# Patient Record
Sex: Female | Born: 2000 | Race: White | Hispanic: No | Marital: Single | Smoking: Never smoker
Health system: Southern US, Community
[De-identification: ages and names within clinical notes are randomized; demographics above are authoritative.]

## PROBLEM LIST (undated history)

## (undated) VITALS — BP 108/59 | HR 72 | Temp 99.0°F | Resp 22 | Wt 114.2 lb

## (undated) DIAGNOSIS — F4310 Post-traumatic stress disorder, unspecified: Secondary | ICD-10-CM

## (undated) DIAGNOSIS — F419 Anxiety disorder, unspecified: Secondary | ICD-10-CM

## (undated) DIAGNOSIS — F329 Major depressive disorder, single episode, unspecified: Secondary | ICD-10-CM

## (undated) DIAGNOSIS — F32A Depression, unspecified: Secondary | ICD-10-CM

## (undated) DIAGNOSIS — Z7251 High risk heterosexual behavior: Secondary | ICD-10-CM

## (undated) DIAGNOSIS — Z113 Encounter for screening for infections with a predominantly sexual mode of transmission: Secondary | ICD-10-CM

## (undated) DIAGNOSIS — N912 Amenorrhea, unspecified: Secondary | ICD-10-CM

---
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
## 2018-02-28 MED ORDER — SPRINTEC 28 0.25-35 MG-MCG PO TABS
PACK | ORAL | 3 refills | Status: DC
Start: 2018-02-28 — End: 2019-07-06

## 2018-02-28 NOTE — Telephone Encounter (Signed)
Patient needs ocp rx sent as a 90 day supply.

## 2019-07-03 NOTE — Telephone Encounter (Signed)
S: Pt calling [facility] for positive pregnancy test  B: requesting appointment  A: Pt had positive home pregnancy tests x 4, LMP was 05/24/19. She is having constipation as well. Pt having mild abdominal cramping, denies bleeding.   R: Pt scheduled for mychart virtual visit for amenorrhea on Friday per her request, 07/06/19 at 11:45 am with [facility]. Pt sent link to set up mychart and advised to call back for further needs. No insurance currently, in process of getting medicaid.     Reason for Disposition  ??? Wants a pregnancy test done in the office    Protocols used: MENSTRUAL PERIOD - MISSED OR LATE-ADULT-[place]

## 2019-07-06 ENCOUNTER — Telehealth
Admit: 2019-07-06 | Discharge: 2019-07-06 | Payer: MEDICAID | Attending: Advanced Practice Midwife | Primary: Family Medicine

## 2019-07-06 DIAGNOSIS — N912 Amenorrhea, unspecified: Secondary | ICD-10-CM

## 2019-07-06 MED ORDER — DOCUSATE SODIUM 100 MG PO CAPS
100 MG | ORAL_CAPSULE | Freq: Two times a day (BID) | ORAL | 0 refills | Status: AC | PRN
Start: 2019-07-06 — End: ?

## 2019-07-06 NOTE — Progress Notes (Signed)
Patient was seen today via Telehealth by agreement and consent in light of the current COVID-19 pandemic. I used the following Telehealth technology: Audio and video capabilities. This patient encounter is appropriate and reasonable under the circumstances given the patient's particular presentation at this time. The patient has been advised of the potential risks and limitations of this mode of treatment (including but not limited to the absence of in-person examination) and has agreed to be treated in a remote fashion in spite of them. Any and all of the patient's/patient's family's questions on this issue have been answered and I have made no promises or guarantees to the patient. The patient has also been advised to contact this office for worsening conditions or problems, and seek emergency medical treatment and/or call 911 if the patient deems either necessary. The patient stated that they are currently in the state of [place]. If the patient is a minor, permission has been obtained by the parent or guardian for the patient to receive medical care at this visit.    Due to COVID 19 Pandemic and current restrictions, the patient consented to telehealth visit.    Suzanne Chang      07/06/2019    Date Of Birth:  2001/01/13      Chief Complaint   Patient presents with   ??? Amenorrhea            HPI:      Suzanne Chang is a 19 y.o. female    The visit is regarding no period and positive urine pregnancy test at home.      No LMP recorded.    EDC based on LMP:  02/18/20    Estimated weeks gestation:  6 week(s) and 1 day(s)    Average Cycle length:  28 days    Menses are regular    Vaginal bleeding:  no    Pelvic pain:  Yes - twinging / pulling    Currently taking a prenatal vitamin:  yes    Has nausea and/or vomiting:  Yes nausea no vomiting    Fatigue:  yes    Breast tenderness:  Yes - had some milky d/c    Smoking:  No - quit    Takes Medications:  yes - prenatals    REVIEW OF SYSTEMS:  Constitutional: No fever,  chills or malaise; No weight change.  Positive for fatigue and appetite changes.  HENT: No vision changes, Headache, Dizziness, Congestion  Respiratory:  Negative for cough and chest tightness  Cardiovascular:  Negative for chest pain, palpitations, and leg swelling.  Breast: No breast abnormalities or lumps  Gastrointestinal: No Indigestion, Heartburn, Diarrhea, Constipation,or Bowel Changes; No Bloody Stools  Genito-Urinary: No Dysuria, Hematuria or Nocturia. No Urinary Incontinence or Vaginal Discharge  Musculoskeletal:  Negative for back pain  Skin:  Negative for pallor and rash  Neurological:  Negative for seizures and headaches  Allergic/Immunologic:  Negative for environmental and food allergies  Psych: Negative for depression, homicidal thoughts, suicidal thoughts, anxiety, and confusion    OB History   Gravida Para Term Preterm AB Living   2 0 0 0 1 0   SAB TAB Ectopic Molar Multiple Live Births   1 0 0 0 0 0      # Outcome Date GA Lbr Len/2nd Weight Sex Delivery Anes PTL Lv   2 Gravida            1 SAB  Past Medical History:   Diagnosis Date   ??? Anxiety    ??? Crohn's disease (HCC)    ??? Depression    ??? Herpes    ??? PTSD (post-traumatic stress disorder)        Past Surgical History:   Procedure Laterality Date   ??? BREAST CYST ASPIRATION Bilateral        Family History   Problem Relation Age of Onset   ??? No Known Problems Mother    ??? No Known Problems Father        Social History     Socioeconomic History   ??? Marital status: Single     Spouse name: Not on file   ??? Number of children: Not on file   ??? Years of education: Not on file   ??? Highest education level: Not on file   Occupational History   ??? Not on file   Social Needs   ??? Financial resource strain: Not on file   ??? Food insecurity     Worry: Not on file     Inability: Not on file   ??? Transportation needs     Medical: Not on file     Non-medical: Not on file   Tobacco Use   ??? Smoking status: Current Some Day Smoker     Types: Cigarettes   ???  Smokeless tobacco: Never Used   Substance and Sexual Activity   ??? Alcohol use: No   ??? Drug use: Not Currently     Types: Marijuana     Comment: quit since found out pregnant   ??? Sexual activity: Yes     Partners: Male   Lifestyle   ??? Physical activity     Days per week: Not on file     Minutes per session: Not on file   ??? Stress: Not on file   Relationships   ??? Social Wellsite geologist on phone: Not on file     Gets together: Not on file     Attends religious service: Not on file     Active member of club or organization: Not on file     Attends meetings of clubs or organizations: Not on file     Relationship status: Not on file   ??? Intimate partner violence     Fear of current or ex partner: Not on file     Emotionally abused: Not on file     Physically abused: Not on file     Forced sexual activity: Not on file   Other Topics Concern   ??? Not on file   Social History Narrative   ??? Not on file         MEDICATIONS:    Current Outpatient Medications   Medication Sig Dispense Refill   ??? docusate sodium (COLACE) 100 MG capsule Take 1 capsule by mouth 2 times daily as needed for Constipation 60 capsule 0   ??? valACYclovir (VALTREX) 500 MG tablet Take 500 mg by mouth 2 times daily Indications: once per day        No current facility-administered medications for this visit.          ALLERGIES:    Allergies as of 07/06/2019 - Review Complete 07/06/2019   Allergen Reaction Noted   ??? Shellfish-derived products  05/31/2016         PHYSICAL EXAM:    Well-appearing, comfortable, attentive  Awake and alert, oriented x 3  Respirations even and  unlabored, no cough  Pleasant mood, full affect  Recent and remote memory intact, no pressured speech or mood lability.      DIAGNOSTICS:  No results found.     LAB RESULTS:  Results for orders placed or performed in visit on 12/09/17   POCT Wet Prep   Result Value Ref Range    Wet Prep Neg whiff     Hyphal Yeast none     White Blood Cells 1:1/epi     Trichomonas, UA none     Clue Cells,  Wet Prep none     Ph normal        DIAGNOSIS:   Diagnosis Orders   1. Amenorrhea  ABO/Rh    Antibody Screen    HCG, Quantitative, Pregnancy       PLAN:  Quant HCG and type and screen when able.    Dating and viability ultrasound - pt wishes to have care with [facility], and does not wish to schedule any follow up with [place] at this time. I let her know she may call and schedule any time if desired or needed.    Advised to take a prenatal vitamin with iron, not a gummy vitamin after the first trimester.  Prescription sent if needed.    Reviewed relief measures for nausea and vomiting including over the counter B6 and doxylamine.      Return for Pt only  wants to do labs. Does not want to schedule U/S or f/u visit at this time..  Labs ordered, [facility] information in [facility]    Electronically signed by Micael Hampshire, APRN - CNM on 07/06/19 at 11:47 AM EDT    Patient was  evaluated by telehealth today for evaluation, counseling and education regarding The encounter diagnosis was Amenorrhea. and Amenorrhea  .

## 2019-07-06 NOTE — Patient Instructions (Signed)
Nausea and Vomiting in Pregnancy    How common is nausea and vomiting of pregnancy?    Nausea and vomiting of pregnancy is a very common condition. Although nausea and vomiting of pregnancy often is called ???morning sickness,??? it can occur at any time of the day. Nausea and vomiting of pregnancy usually is not harmful to the developing baby, but it can have a serious effect on your life, including your ability to work or do your normal daily activities.     When does nausea and vomiting of pregnancy start?    Nausea and vomiting of pregnancy usually starts before 9 weeks of pregnancy. For most women, it goes away by the second trimester (14 weeks of pregnancy). For some women, it lasts for several weeks or months. For a few women, it lasts throughout the entire pregnancy.    What is the difference between mild and severe nausea and vomiting of pregnancy?    Some women feel nauseated for a short time each day and may vomit once or twice. This usually is defined as mild nausea and vomiting of pregnancy. In more severe cases, nausea lasts several hours each day and vomiting occurs more frequently. Deciding to seek treatment depends on how much nausea and vomiting of pregnancy affects your life and causes you concern, not whether your condition is ???mild??? or ???severe.???    What is hyperemesis gravidarum?    Hyperemesis gravidarum is the most severe form of nausea and vomiting of pregnancy. It occurs in up to 3% of pregnancies. This condition may be diagnosed when a woman has lost 5% of her prepregnancy weight and has other problems related to dehydration (loss of body fluids). Women with hyperemesis gravidarum need treatment to stop their vomiting and restore body fluids. Sometimes treatment in a hospital is needed.    Am I at risk of severe nausea and vomiting of pregnancy?    If you have any of the following factors, your risk of severe nausea and vomiting of pregnancy may be increased:  ??? Being pregnant with more than  one baby (multiple pregnancy)   ??? Past pregnancy with nausea and vomiting (either mild or severe)  ??? Your mother or sister had severe nausea and vomiting of pregnancy  ??? History of motion sickness or migraines  ??? Being pregnant with a female fetus    Could nausea and vomiting during pregnancy be caused by another medical condition?    Some medical conditions can cause nausea and vomiting during pregnancy. These include an ulcer, food-related illness, thyroid disease, or gallbladder disease. Your obstetrician may suspect that you have one of these conditions if you have signs or symptoms that do not usually occur with nausea and vomiting of pregnancy:  ??? Nausea and vomiting that occurs for the first time after 9 weeks of pregnancy  ??? Abdominal pain or tenderness  ??? Fever  ??? Headache  ??? Enlarged thyroid gland (swelling in the front of the neck)    Can nausea and vomiting of pregnancy affect my baby?    Having nausea and vomiting of pregnancy usually does not harm your health or your baby???s health. It does not mean your baby is sick. It can become more of a problem if you cannot keep down any food or fluids and begin to lose weight. When this happens, it sometimes can affect the baby???s weight at birth. You also can develop problems with your thyroid, liver, and fluid balance.    When is the best   time to treat nausea and vomiting of pregnancy?    Because severe nausea and vomiting of pregnancy is hard to treat and can cause health problems, many experts recommend early treatment so that it does not become severe.     What can I do to feel better if I have nausea and vomiting of pregnancy?    Diet and lifestyle changes may help you feel better. You may need to try more than one of these suggestions:   ??? Take a multivitamin.  ??? Try eating dry toast or crackers in the morning before you get out of bed to avoid moving around on an empty stomach.  ??? Drink fluids often.  ??? Avoid smells that bother you.   ??? Eat small, frequent  meals instead of three large meals.  ??? Try bland foods. For example, the ???BRATT??? diet (bananas, rice, applesauce, toast, and tea) is low in fat and easy to digest.  ??? Try ginger ale made with real ginger, ginger tea made from fresh grated ginger, ginger capsules, and ginger candies.   If you do vomit a lot, it can cause some of your tooth enamel to wear away. This happens because your stomach contains a lot of acid. Rinsing your mouth with a teaspoon of baking soda dissolved in a cup of water may help neutralize the acid and protect your teeth.    Is there medical treatment for nausea and vomiting of pregnancy?    If diet and lifestyle changes do not help your symptoms, or if you have severe nausea and vomiting of pregnancy, medical treatment may be needed. If other medical conditions are ruled out, certain medications can be given to treat nausea and vomiting of pregnancy:  ??? Vitamin B6 and doxylamine--Vitamin B6 is a safe, over-the-counter treatment that may be tried first. Doxylamine, a medication found in over-the-counter sleep aids, may be added if vitamin B6 alone does not relieve symptoms. A prescription drug that combines vitamin B6 and doxylamine is available. Both drugs--taken alone or together--have been found to be safe to take during pregnancy and have no harmful effects on the baby.     ??? Try Vitamin B6 25 mg, take it 3 times per day.  Unisom sleep tablet 25 mg (active ingredient doxylamine), take one per day before bed.  These won't help if just taken as needed.  They should be taken daily be most effective.    ??? ???Antiemetic??? drugs--If vitamin B6 and doxylamine do not work, ???antiemetic??? drugs may be prescribed. These drugs prevent vomiting. Many antiemetic drugs have been shown to be safe to use during pregnancy. Others have conflicting or limited safety information. You and your obstetrician or other members of your health care team can discuss all of these factors to determine the best treatment for  your personal situation.    What may happen if my nausea and vomiting is severe or I have hyperemesis gravidarum?    You may need to stay in the hospital until your symptoms are under control. Lab tests may be done to check how your liver is working. If you are dehydrated from loss of fluids, you may receive fluids and vitamins through an intravenous line. If your vomiting cannot be controlled, you may need additional medication. If you continue to lose weight, sometimes tube feeding is recommended to ensure that you and your baby are getting enough nutrients.   If you have further questions, contact your healthcare provider.    Copyright December 2015 by the   [employer]      ==============================================    [place] [address]  Phone: [phone]  Fax: [phone]    Hours:  Monday - Friday   7:30 a.m. - 12 p.m., 1 p.m. - 4:30p.m., walk-ins welcome    [facility]  [address] [place] [address]  Phone: [phone]  Fax: [phone]    Hours :  M-T TH-F  7:30 a.m - 12:00 p.m., 1:00 p.m- 4:30 p.m.   Wednesday  7:30 a.m. - 11:30 a.m.    [facility]  [address] [place], [place] [address]  Phone: [phone]  Fax: [phone]    Hours:  Monday-Friday  8:00 a.m. - 12:00 p.m., 1:00 p.m. - 5:00 p.m., walk-ins welcome    [facility]  [address] [place] [address]   Phone: [phone]   Fax: [phone]    Hours:  Monday - Friday   7:00 a.m. - 5:00 p.m., walk-ins welcome    Saturday   8:00 a.m. - 12:00 p.m., walk-ins welcome    [facility]  [address] [place] [address]  Phone: [phone]   Fax: [phone]    Hours:  Monday, Tuesday, Thursday, Friday  7:30 a.m. - 12:00 p.m., 1:00 p.m. - 4:30 p.m., walk-ins welcome    Wednesday   7:30 a.m. - 11:30 a.m.,  walk-ins welcome    [facility] [place] [place] [address]  Phone: [phone]  Fax: [phone]    Hours:   Monday - Friday  7:30 a.m. - 4:30 p.m., walk-ins welcome, convenient lunchtime access    Saturday  8 a.m. - Noon, walk-ins welcome    [facility]  [address] [place] [address]  Phone: [phone]  Fax: [phone]    Hours:  Monday-Friday  7:30 a.m. - 4:30 p.m., walk-ins welcome    [facility]  [address], [place] [address]  Phone: [phone]  Fax: [phone]    Hours:  Monday - Friday  7:30 am - 4:30 p.m., walk-ins welcome      Saturday  8:00 a.m. to 12:00 p.m., walk-ins welcome    [facility]  [address] [place],  [address]   Phone: [phone]   Fax: [phone]    Hours:  Monday - Friday   7:30 a.m. - 12:00 p.m., 1:00 p.m. - 4:30 p.m., walk-ins welcome     Saturday   8:00 a.m. - 12:00 p.m., walk-ins welcome    [facility]  [address] [place] [address]  Phone: [phone]  Fax: [phone]    Hours:  Monday - Friday  7:30 a.m. - 4:30 p.m., walk-ins welcome    Saturday  8:00 a.m. - 12:00 p.m., walk-ins welcome    [facility]  [place] [address]   Phone: [phone]  Fax: [phone]    Hours:  Monday -Friday   7:30 a.m. - 12:00 p.m., 1:00 p.m. - 4:30 p.m., walk-ins welcome    [facility]  [address]   [place], [place] [address]     Phone: [phone]   Fax: [phone]     Hours of Operation:  Monday thru Friday   7:30 a.m. - 4:30 p.m.     Closed for lunch Noon to 1 p.m.

## 2019-07-06 NOTE — Progress Notes (Signed)
Start date of LMP - 05/24/2019    GA based off lmp - [redacted]w[redacted]d    EDD based off lmp- 02/28/2020      Pt is taking One a day prenatal - gel capsules and Valtrex as needed.     This is pt's second pregnancy. First was a miscarriage last year in 2020.    Pt is a current cigarette smoker and former marijuana smoker, but quit when she found out she was pregnant.

## 2019-07-10 ENCOUNTER — Encounter: Admit: 2019-07-10 | Discharge: 2019-07-10 | Payer: MEDICAID | Primary: Family Medicine

## 2019-07-10 ENCOUNTER — Encounter

## 2019-07-10 LAB — HCG, QUANTITATIVE, PREGNANCY: hCG Quant: 17614 m[IU]/mL — AB

## 2019-07-11 LAB — ABO/RH: Rh Type: POSITIVE NA

## 2019-07-11 LAB — ANTIBODY SCREEN: Antibody Screen: NEGATIVE NA

## 2019-07-11 NOTE — Telephone Encounter (Signed)
Name of Caller: Suzanne Chang phone number: [phone]    Relationship to Patient: patient    Provider: Greggory Stallion    Practice:  [facility]    Chief Complaint/Reason for Call: [name] reported that she saw her lab results in MyChart, but does not understand them.  Patient also was confused about how far along she is with her pregnancy.  She reported that she was told she was six weeks pregnant, but was given a due date indicating she is [redacted] weeks pregnant.  Please advise.    Best time of day caller can be reached: Any       Patient advised that office/PCP has 24-48 business hours to return their call: No

## 2019-07-13 NOTE — Telephone Encounter (Signed)
Pt. Notified.

## 2019-07-13 NOTE — Telephone Encounter (Signed)
If she is not going to have care with [place], I am not comfortable ordering any tests for her. They should establish care with her and order any tests she needs. Thank you.

## 2019-07-13 NOTE — Telephone Encounter (Signed)
Pt. Transferring care to [facility]- said she was told she could get an [place] there if we faxed order, then they could start her care.     Are you willing to enter order for her [place]? Needs faxed to:  [phone]
